# Patient Record
Sex: Male | Born: 1965 | Race: Black or African American | Hispanic: No | Marital: Married | Smoking: Never smoker
Health system: Southern US, Community
[De-identification: ages and names within clinical notes are randomized; demographics above are authoritative.]

## PROBLEM LIST (undated history)

## (undated) DIAGNOSIS — R42 Dizziness and giddiness: Secondary | ICD-10-CM

## (undated) DIAGNOSIS — G4730 Sleep apnea, unspecified: Secondary | ICD-10-CM

## (undated) DIAGNOSIS — I10 Essential (primary) hypertension: Secondary | ICD-10-CM

## (undated) HISTORY — DX: Sleep apnea, unspecified: G47.30

## (undated) HISTORY — PX: SHOULDER SURGERY: SHX246

## (undated) HISTORY — PX: KNEE SURGERY: SHX244

## (undated) HISTORY — PX: APPENDECTOMY: SHX54

## (undated) HISTORY — PX: BUNIONECTOMY: SHX129

---

## 2003-06-02 ENCOUNTER — Encounter: Admission: RE | Admit: 2003-06-02 | Discharge: 2003-06-02 | Payer: Self-pay | Admitting: Occupational Medicine

## 2003-12-25 ENCOUNTER — Encounter: Admission: RE | Admit: 2003-12-25 | Discharge: 2003-12-25 | Payer: Self-pay | Admitting: Emergency Medicine

## 2004-07-07 ENCOUNTER — Emergency Department (HOSPITAL_COMMUNITY): Admission: EM | Admit: 2004-07-07 | Discharge: 2004-07-07 | Payer: Self-pay | Admitting: Family Medicine

## 2005-02-09 ENCOUNTER — Emergency Department (HOSPITAL_COMMUNITY): Admission: EM | Admit: 2005-02-09 | Discharge: 2005-02-09 | Payer: Self-pay | Admitting: Emergency Medicine

## 2005-08-22 ENCOUNTER — Emergency Department (HOSPITAL_COMMUNITY): Admission: EM | Admit: 2005-08-22 | Discharge: 2005-08-22 | Payer: Self-pay | Admitting: Emergency Medicine

## 2007-11-07 ENCOUNTER — Encounter: Admission: RE | Admit: 2007-11-07 | Discharge: 2007-11-07 | Payer: Self-pay | Admitting: Internal Medicine

## 2010-08-07 ENCOUNTER — Encounter: Payer: Self-pay | Admitting: Emergency Medicine

## 2010-11-17 ENCOUNTER — Other Ambulatory Visit: Payer: Self-pay | Admitting: Neurosurgery

## 2010-11-17 DIAGNOSIS — M5020 Other cervical disc displacement, unspecified cervical region: Secondary | ICD-10-CM

## 2010-11-21 ENCOUNTER — Ambulatory Visit
Admission: RE | Admit: 2010-11-21 | Discharge: 2010-11-21 | Disposition: A | Discharge status: Home / Self Care | Payer: Medicare HMO | Source: Ambulatory Visit | Attending: Neurosurgery | Admitting: Neurosurgery

## 2010-11-21 DIAGNOSIS — M5020 Other cervical disc displacement, unspecified cervical region: Secondary | ICD-10-CM

## 2010-12-16 ENCOUNTER — Other Ambulatory Visit: Payer: Self-pay | Admitting: Neurosurgery

## 2010-12-16 DIAGNOSIS — M5020 Other cervical disc displacement, unspecified cervical region: Secondary | ICD-10-CM

## 2010-12-19 ENCOUNTER — Ambulatory Visit
Admission: RE | Admit: 2010-12-19 | Discharge: 2010-12-19 | Disposition: A | Discharge status: Home / Self Care | Payer: Medicare HMO | Source: Ambulatory Visit | Attending: Neurosurgery | Admitting: Neurosurgery

## 2010-12-19 DIAGNOSIS — M5020 Other cervical disc displacement, unspecified cervical region: Secondary | ICD-10-CM

## 2012-01-11 ENCOUNTER — Emergency Department (INDEPENDENT_AMBULATORY_CARE_PROVIDER_SITE_OTHER)
Admission: EM | Admit: 2012-01-11 | Discharge: 2012-01-11 | Disposition: A | Discharge status: Home / Self Care | Payer: Managed Care, Other (non HMO) | Source: Home / Self Care | Attending: Emergency Medicine | Admitting: Emergency Medicine

## 2012-01-11 ENCOUNTER — Encounter (HOSPITAL_COMMUNITY): Payer: Self-pay | Admitting: Emergency Medicine

## 2012-01-11 DIAGNOSIS — M26629 Arthralgia of temporomandibular joint, unspecified side: Secondary | ICD-10-CM

## 2012-01-11 DIAGNOSIS — I10 Essential (primary) hypertension: Secondary | ICD-10-CM

## 2012-01-11 DIAGNOSIS — R42 Dizziness and giddiness: Secondary | ICD-10-CM

## 2012-01-11 DIAGNOSIS — Z760 Encounter for issue of repeat prescription: Secondary | ICD-10-CM

## 2012-01-11 HISTORY — DX: Essential (primary) hypertension: I10

## 2012-01-11 HISTORY — DX: Dizziness and giddiness: R42

## 2012-01-11 MED ORDER — AMLODIPINE BESYLATE 10 MG PO TABS: 10.0000 mg | ORAL_TABLET | Freq: Every day | ORAL | Status: DC

## 2012-01-11 MED ORDER — LISINOPRIL 40 MG PO TABS: 40.0000 mg | ORAL_TABLET | Freq: Every day | ORAL | Status: AC

## 2012-01-11 MED ORDER — HYDROCHLOROTHIAZIDE 25 MG PO TABS: 25.0000 mg | ORAL_TABLET | Freq: Every day | ORAL | Status: AC

## 2012-01-11 MED ORDER — MECLIZINE HCL 25 MG PO TABS: 25.0000 mg | ORAL_TABLET | Freq: Four times a day (QID) | ORAL | Status: AC

## 2012-01-11 NOTE — Discharge Instructions (Signed)
Decrease your salt intake. diet and exercise will lower your blood pressure significantly. It is important to keep your blood pressure under good control, as having a elevated for prolonged periods of time significantly increases your risk of stroke, heart attacks, kidney damage, eye damage, and other problems. Return here in a week for blood pressure recheck if you're able to find a primary care physician by then. Return immediately to the ER if you start having chest pain, headache, problems seeing, problems talking, problems walking, if you feel like you're about to pass out, if you do pass out, if you have a seizure, or for any other concerns.[facility]: [address] [place] [address] [phone]  [facility]: [address] [place] [address]   [phone]  [facility]: [address] [place] [address] [phone]  [facility] : [address] [place] [address] [phone]  [facility]: [address] [place] [address] [phone]  [facility]: [address] [place] [address] [phone]  Dr. Oneal Grout [address] [facility] [place] [address] [phone]

## 2012-01-11 NOTE — ED Notes (Addendum)
Patient has been out of medicine for 4 days.  Patient normally gets refills from an urgent care in [place] where he lives.  Denies chest pain

## 2012-01-11 NOTE — ED Provider Notes (Signed)
History     CSN: [id number]  Arrival date & time 01/11/12  1132   First MD Initiated Contact with Patient 01/11/12 1227      Chief Complaint  Patient presents with  . Dizziness    (Consider location/radiation/quality/duration/timing/severity/associated sxs/prior treatment) HPI Comments: Patient presents with multiple complaints: First, patient reports right ear tinnitus, right ear pain, and a sensation that  "things seem to be moving to the right". reports sx worse when when turning his head. States sensation of "things moving" it started several hours prior to arrival. The vertigo lasted about 5 minutes, and then resolved. Symptoms are better when he closes his eyes, and lies still. Some nausea, no vomiting. No headaches, blurred vision. No ear fullness. No recent viral illness, antibiotic use, nasal congestion, sinus pain/pressure, purulent nasal drainage. No dental pain. No recent or remote history of trauma to his head. He has a history of vertigo, and states this feels identical to that. Said he had tinnitus and ear pain with his previous episode of vertigo. He also states that his jaw clicks when he opens and closes it, and that his ear pain is worse with eating, jaw movement. Patient also states that he ran out of all 3 of his blood pressure medications about 5 days ago. He denies headaches, seizures, dysarthria, focal weakness. No chest pain, shortness of breath, pain radiating through to the back, abdominal pain, and urea, hematuria, lower extremity swelling. Denies over-the-counter medication use, illicit drug use. No h/o seizure, stroke, diabetes, coronary disease.  ROS as noted in HPI. All other ROS negative.    Patient is a 46 y.o. male presenting with neurologic complaint and hypertension. The history is provided by the patient. No language interpreter was used.  Neurologic Problem The primary symptoms include dizziness. Primary symptoms do not include headaches, nausea or  vomiting. The symptoms began 6 to 12 hours ago. The symptoms are resolved.  He describes the dizziness as a sensation of spinning. The dizziness has been resolved since its onset. It is a recurrent problem. The dizziness is associated with rotation. Dizziness also occurs with tinnitus. Dizziness does not occur with blurred vision, hearing loss, nausea, vomiting, weakness or diaphoresis.  Additional symptoms include tinnitus and vertigo. Additional symptoms do not include weakness, loss of balance, photophobia or hearing loss. Medical issues also include diabetes and hypertension. Medical issues do not include seizures or cerebral vascular accident.  Hypertension This is a chronic problem. The current episode started more than 1 week ago. The problem occurs constantly. The problem has not changed since onset.Pertinent negatives include no chest pain, no abdominal pain, no headaches and no shortness of breath. Nothing aggravates the symptoms. Nothing relieves the symptoms. He has tried nothing for the symptoms. The treatment provided no relief.    Past Medical History  Diagnosis Date  . Hypertension   . Vertigo     Past Surgical History  Procedure Date  . Appendectomy   . Bunionectomy   . Shoulder surgery     left    History reviewed. No pertinent family history.  History  Substance Use Topics  . Smoking status: Never Smoker   . Smokeless tobacco: Not on file  . Alcohol Use: No      Review of Systems  Constitutional: Negative for diaphoresis.  HENT: Positive for tinnitus. Negative for hearing loss.   Eyes: Negative for blurred vision and photophobia.  Respiratory: Negative for shortness of breath.   Cardiovascular: Negative for chest pain.  Gastrointestinal:  Negative for nausea, vomiting and abdominal pain.  Neurological: Positive for dizziness and vertigo. Negative for weakness, headaches and loss of balance.    Allergies  Penicillins and Salicylates  Home Medications    Current Outpatient Rx  Name Route Sig Dispense Refill  . AMLODIPINE BESYLATE 10 MG PO TABS Oral Take 1 tablet (10 mg total) by mouth daily. 30 tablet 0  . HYDROCHLOROTHIAZIDE 25 MG PO TABS Oral Take 1 tablet (25 mg total) by mouth daily. 30 tablet 0  . LISINOPRIL 40 MG PO TABS Oral Take 1 tablet (40 mg total) by mouth daily. 30 tablet 0  . MECLIZINE HCL 25 MG PO TABS Oral Take 1 tablet (25 mg total) by mouth 4 (four) times daily. 28 tablet 0    BP 170/116  Pulse 80  Temp 98.3 F (36.8 C) (Oral)  Resp 16  SpO2 100% Filed Vitals:   01/11/12 1152 01/11/12 1207  BP: 159/106 170/116  Pulse: 78 80  Temp: 98.3 F (36.8 C)   TempSrc: Oral   Resp: 20 16  SpO2: 97% 100%     Physical Exam  Vitals reviewed. Constitutional: He appears well-developed and well-nourished. No distress.  HENT:  Right Ear: Tympanic membrane normal.  Left Ear: Tympanic membrane normal.  Nose: Nose normal. Right sinus exhibits no maxillary sinus tenderness and no frontal sinus tenderness. Left sinus exhibits no maxillary sinus tenderness and no frontal sinus tenderness.  Mouth/Throat: Uvula is midline, oropharynx is clear and moist and mucous membranes are normal.       Tenderness at right TMJ. No tenderness L side. Palpable click, pain aggravated with opening and closing jaw.  Eyes: Conjunctivae and EOM are normal. Pupils are equal, round, and reactive to light.  Fundoscopic exam:      The right eye shows no hemorrhage and no papilledema.       The left eye shows no hemorrhage and no papilledema.  Neck: Normal range of motion. Neck supple. No spinous process tenderness and no muscular tenderness present. Normal range of motion present. No Brudzinski's sign and no Kernig's sign noted.  Cardiovascular: Normal rate, regular rhythm, normal heart sounds and intact distal pulses.   Pulmonary/Chest: Effort normal and breath sounds normal.  Abdominal: He exhibits no distension.  Musculoskeletal: Normal range of  motion. He exhibits no edema.  Lymphadenopathy:    He has no cervical adenopathy.  Neurological: He is alert. He has normal strength and normal reflexes. He displays normal reflexes. No cranial nerve deficit or sensory deficit. He exhibits normal muscle tone. He displays a negative Romberg sign. Coordination and gait normal.       Positive Dix-Hallpike right side. Rapid hand movement, Finger-> nose, heel-> shin, tandem gait steady  Skin: Skin is warm. No rash noted.  Psychiatric: He has a normal mood and affect. His speech is normal and behavior is normal. Judgment and thought content normal.    ED Course  Procedures (including critical care time)  Labs Reviewed - No data to display No results found.   1. Vertigo   2. TMJ arthralgia   3. Hypertension   4. Medication refill     MDM  Previous records reviewed. As a history of herniated cervical disc. Otherwise unable to review old records.  H&P most consistent with peripheral vertigo. No evidence of otitis, sinusitis, or central causes of his symptoms at this time. His neurologic exam is completely normal except for a positive Dix-Hallpike. Will send him home on some meclizine for this.  He also is tenderness in his right TMJ, which would explain his right-sided otalgia. 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  Blood pressure is elevated, but he has not taken his medication over 5 days. Will restart his medications, and have him followup with her primary care physician of his choice. He can come back here for blood pressure recheck in a week if he is unable to find a primary care physician by then. He has no red flags concerning for hypertensive emergency currently. Discussed signs and symptoms that should prompt his return to the emergency department. Discussed MDM and plan with patient and his spouse. They agree with plan.  Luiz Blare, MD 01/11/12 1723

## 2012-01-11 NOTE — ED Notes (Signed)
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  Also reports right ear hurting for 2 days and worsened yesterday.  Patient reports these are symptoms experienced once before when diagnosed with vertigo and involved this same ear.

## 2013-05-30 ENCOUNTER — Other Ambulatory Visit: Payer: Self-pay | Admitting: Occupational Medicine

## 2013-05-30 ENCOUNTER — Ambulatory Visit: Payer: Self-pay

## 2013-05-30 DIAGNOSIS — Z0000 Encounter for general adult medical examination without abnormal findings: Secondary | ICD-10-CM

## 2016-01-14 ENCOUNTER — Ambulatory Visit: Payer: Self-pay

## 2016-01-14 ENCOUNTER — Other Ambulatory Visit: Payer: Self-pay | Admitting: Occupational Medicine

## 2016-01-14 DIAGNOSIS — Z0000 Encounter for general adult medical examination without abnormal findings: Secondary | ICD-10-CM

## 2017-09-14 ENCOUNTER — Encounter: Payer: Self-pay | Admitting: Internal Medicine

## 2017-10-01 ENCOUNTER — Encounter (HOSPITAL_COMMUNITY): Payer: Self-pay | Admitting: Emergency Medicine

## 2017-10-01 ENCOUNTER — Other Ambulatory Visit: Payer: Self-pay

## 2017-10-01 ENCOUNTER — Emergency Department (HOSPITAL_COMMUNITY)
Admission: EM | Admit: 2017-10-01 | Discharge: 2017-10-02 | Disposition: A | Discharge status: Home / Self Care | Payer: 59 | Attending: Emergency Medicine | Admitting: Emergency Medicine

## 2017-10-01 ENCOUNTER — Emergency Department (HOSPITAL_COMMUNITY): Payer: 59

## 2017-10-01 DIAGNOSIS — I10 Essential (primary) hypertension: Secondary | ICD-10-CM | POA: Insufficient documentation

## 2017-10-01 DIAGNOSIS — R110 Nausea: Secondary | ICD-10-CM | POA: Diagnosis not present

## 2017-10-01 DIAGNOSIS — R1013 Epigastric pain: Secondary | ICD-10-CM | POA: Insufficient documentation

## 2017-10-01 DIAGNOSIS — Z79899 Other long term (current) drug therapy: Secondary | ICD-10-CM | POA: Diagnosis not present

## 2017-10-01 DIAGNOSIS — R109 Unspecified abdominal pain: Secondary | ICD-10-CM

## 2017-10-01 DIAGNOSIS — R55 Syncope and collapse: Secondary | ICD-10-CM | POA: Diagnosis not present

## 2017-10-01 LAB — LIPASE, BLOOD: Lipase: 28 U/L (ref 11–51)

## 2017-10-01 LAB — CBC
HCT: 47 % (ref 39.0–52.0)
Hemoglobin: 15.9 g/dL (ref 13.0–17.0)
MCH: 28.6 pg (ref 26.0–34.0)
MCHC: 33.8 g/dL (ref 30.0–36.0)
MCV: 84.5 fL (ref 78.0–100.0)
Platelets: 125 10*3/uL — ABNORMAL LOW (ref 150–400)
RBC: 5.56 MIL/uL (ref 4.22–5.81)
RDW: 13.8 % (ref 11.5–15.5)
WBC: 5.4 10*3/uL (ref 4.0–10.5)

## 2017-10-01 LAB — COMPREHENSIVE METABOLIC PANEL
ALT: 17 U/L (ref 17–63)
AST: 21 U/L (ref 15–41)
Albumin: 4.2 g/dL (ref 3.5–5.0)
Alkaline Phosphatase: 77 U/L (ref 38–126)
Anion gap: 12 (ref 5–15)
BUN: 16 mg/dL (ref 6–20)
CO2: 28 mmol/L (ref 22–32)
Calcium: 8.9 mg/dL (ref 8.9–10.3)
Chloride: 97 mmol/L — ABNORMAL LOW (ref 101–111)
Creatinine, Ser: 1.41 mg/dL — ABNORMAL HIGH (ref 0.61–1.24)
GFR calc Af Amer: >60 mL/min (ref >60)
GFR calc non Af Amer: 57 mL/min — ABNORMAL LOW (ref >60)
Glucose, Bld: 94 mg/dL (ref 65–99)
Potassium: 3.2 mmol/L — ABNORMAL LOW (ref 3.5–5.1)
Sodium: 137 mmol/L (ref 135–145)
Total Bilirubin: 1.1 mg/dL (ref 0.3–1.2)
Total Protein: 7.7 g/dL (ref 6.5–8.1)

## 2017-10-01 LAB — URINALYSIS, ROUTINE W REFLEX MICROSCOPIC
Bacteria, UA: NONE SEEN
Bilirubin Urine: NEGATIVE
Glucose, UA: NEGATIVE mg/dL
Hgb urine dipstick: NEGATIVE
Ketones, ur: NEGATIVE mg/dL
Leukocytes, UA: NEGATIVE
Nitrite: NEGATIVE
Protein, ur: 100 mg/dL — AB
Specific Gravity, Urine: 1.03 (ref 1.005–1.030)
pH: 5 (ref 5.0–8.0)

## 2017-10-01 LAB — TROPONIN I: Troponin I: <0.03 ng/mL (ref <0.03)

## 2017-10-01 MED ORDER — IOPAMIDOL (ISOVUE-300) INJECTION 61%
100.0000 mL | Freq: Once | INTRAVENOUS | Status: AC | PRN
  Administered 2017-10-01: 100 mL via INTRAVENOUS

## 2017-10-01 NOTE — ED Provider Notes (Signed)
[facility] EMERGENCY DEPARTMENT Provider Note   CSN: [id number] Arrival date & time: 10/01/17  1922     History   Chief Complaint Chief Complaint  Patient presents with  . Abdominal Pain    HPI Don Booth is a 52 y.o. male.   Abdominal Pain   This is a new problem. The current episode started 2 days ago. The problem occurs constantly. The problem has not changed since onset.The pain is associated with eating. The pain is located in the epigastric region. The quality of the pain is aching. The pain is moderate. Associated symptoms include nausea.  Loss of Consciousness   This is a new problem. The current episode started 6 to 12 hours ago. The problem occurs constantly. The problem has been resolved. Length of episode of loss of consciousness: unsure. Associated symptoms include abdominal pain and nausea. He has tried nothing for the symptoms.    Past Medical History:  Diagnosis Date  . Hypertension   . Vertigo     There are no active problems to display for this patient.   Past Surgical History:  Procedure Laterality Date  . APPENDECTOMY    . BUNIONECTOMY    . SHOULDER SURGERY     left       Home Medications    Prior to Admission medications   Medication Sig Start Date End Date Taking? Authorizing Provider  amLODipine (NORVASC) 10 MG tablet Take 1 tablet (10 mg total) by mouth daily. 01/11/12   Domenick Gong, MD  hydrochlorothiazide (HYDRODIURIL) 25 MG tablet Take 1 tablet (25 mg total) by mouth daily. 01/11/12   Domenick Gong, MD  lisinopril (PRINIVIL,ZESTRIL) 40 MG tablet Take 1 tablet (40 mg total) by mouth daily. 01/11/12   Domenick Gong, MD    Family History History reviewed. No pertinent family history.  Social History Social History   Tobacco Use  . Smoking status: Never Smoker  . Smokeless tobacco: Never Used  Substance Use Topics  . Alcohol use: No  . Drug use: No     Allergies   Penicillins and Salicylates   Review of  Systems Review of Systems  Cardiovascular: Positive for syncope.  Gastrointestinal: Positive for abdominal pain and nausea.  All other systems reviewed and are negative.    Physical Exam Updated Vital Signs BP 139/90   Pulse 72   Temp 98.6 F (37 C) (Oral)   Resp 14   Ht 5\' 11"  (1.803 m)   Wt 131.5 kg (290 lb)   SpO2 99%   BMI 40.45 kg/m   Physical Exam  Constitutional: He is oriented to person, place, and time. He appears well-developed and well-nourished.  HENT:  Head: Normocephalic and atraumatic.  Eyes: Conjunctivae and EOM are normal.  Neck: Normal range of motion.  Cardiovascular: Normal rate.  Pulmonary/Chest: Effort normal. No respiratory distress.  Abdominal: Soft. He exhibits no distension.  Musculoskeletal: Normal range of motion. He exhibits no edema or deformity.  Neurological: He is alert and oriented to person, place, and time. No cranial nerve deficit. Coordination normal.  Skin: Skin is warm and dry.  Nursing note and vitals reviewed.    ED Treatments / Results  Labs (all labs ordered are listed, but only abnormal results are displayed) Labs Reviewed  COMPREHENSIVE METABOLIC PANEL - Abnormal; Notable for the following components:      Result Value   Potassium 3.2 (*)    Chloride 97 (*)    Creatinine, Ser 1.41 (*)    GFR  calc non Af Amer 57 (*)    All other components within normal limits  CBC - Abnormal; Notable for the following components:   Platelets 125 (*)    All other components within normal limits  URINALYSIS, ROUTINE W REFLEX MICROSCOPIC - Abnormal; Notable for the following components:   Protein, ur 100 (*)    Squamous Epithelial / LPF 0-5 (*)    All other components within normal limits  LIPASE, BLOOD  TROPONIN I    EKG  EKG Interpretation  Date/Time:  Monday October 01 2017 19:47:37 EDT Ventricular Rate:  75 PR Interval:  150 QRS Duration: 92 QT Interval:  380 QTC Calculation: 424 R Axis:   -36 Text Interpretation:   Normal sinus rhythm Left axis deviation Abnormal ECG No significant change since last tracing Confirmed by Marily MemosMesner, Jahnasia Tatum [phone]([id number]) on 10/01/2017 10:43:28 PM       Radiology Ct Abdomen Pelvis W Contrast  Result Date: 10/01/2017 CLINICAL DATA:  Abdominal pain with vomiting EXAM: CT ABDOMEN AND PELVIS WITH CONTRAST TECHNIQUE: Multidetector CT imaging of the abdomen and pelvis was performed using the standard protocol following bolus administration of intravenous contrast. CONTRAST:  100mL ISOVUE-300 IOPAMIDOL (ISOVUE-300) INJECTION 61% COMPARISON:  09/01/2011 FINDINGS: Lower chest: Lung bases demonstrate no acute consolidation or pleural effusion. Normal heart size. Hepatobiliary: Multiple subcentimeter hypodense foci in the liver, no significant change, likely cysts. Larger lesions are consistent with cysts. No calcified stones in the gallbladder. Negative for biliary dilatation. Pancreas: Unremarkable. No pancreatic ductal dilatation or surrounding inflammatory changes. Spleen: Normal in size without focal abnormality. Adrenals/Urinary Tract: Adrenal glands are unremarkable. Kidneys are normal, without renal calculi, focal lesion, or hydronephrosis. Bladder is slightly thick walled but is nearly empty. Stomach/Bowel: Stomach is within normal limits. Status post appendectomy. No evidence of bowel wall thickening, distention, or inflammatory changes. Vascular/Lymphatic: Nonaneurysmal aorta. No significantly enlarged lymph nodes. Reproductive: Prostate is unremarkable. Other: Negative for free air or free fluid. Interval umbilical hernia repair. Musculoskeletal: Degenerative changes at L5-S1. Trace retrolisthesis of L5 on S1. Slight increased size of a sclerotic focus in the right anterior acetabulum, possibly bone island. IMPRESSION: 1. No CT evidence for acute intra-abdominal or pelvic abnormality. 2. Multiple cysts in the liver 3. Slightly thick-walled appearance of the bladder likely due to under distension  Electronically Signed   By: Jasmine PangKim  Fujinaga M.D.   On: 10/01/2017 22:47    Procedures Procedures (including critical care time)  Medications Ordered in ED Medications  iopamidol (ISOVUE-300) 61 % injection 100 mL (100 mLs Intravenous Contrast Given 10/01/17 2228)     Initial Impression / Assessment and Plan / ED Course  I have reviewed the triage vital signs and the nursing notes.  Pertinent labs & imaging results that were available during my care of the patient were reviewed by me and considered in my medical decision making (see chart for details).     Syncope vs seizure of unclear etiology. Gi illness of unclear etiolgoy as well. Appears well. Workup negative.  Monitor for multiple hours here without any clear arrhythmias.  Patient feels close to baseline.  Unsure if he has some type of vagal reaction because of vomiting.  Could also have been a seizure as he did have some stool on himself when he woke up.  Will refer to PCP for further workup and management.  Final Clinical Impressions(s) / ED Diagnoses   Final diagnoses:  Abdominal pain, unspecified abdominal location  Syncope, unspecified syncope type    ED Discharge Orders  None       Emogene Muratalla, Barbara Cower, MD 10/01/17 2356

## 2017-10-01 NOTE — ED Triage Notes (Signed)
Patient has had abdominal pain since Saturday, today had two episodes of vomiting, one episode of diarrhea, and on the way to work, patient had syncopal episode in car, not sure how long it lasted.

## 2017-10-12 ENCOUNTER — Encounter: Payer: Self-pay | Admitting: Internal Medicine

## 2017-11-09 ENCOUNTER — Other Ambulatory Visit: Payer: Self-pay

## 2017-11-09 ENCOUNTER — Ambulatory Visit (AMBULATORY_SURGERY_CENTER): Payer: Self-pay | Admitting: *Deleted

## 2017-11-09 ENCOUNTER — Encounter: Payer: Self-pay | Admitting: Internal Medicine

## 2017-11-09 VITALS — Ht 71.0 in | Wt 292.0 lb

## 2017-11-09 DIAGNOSIS — Z1211 Encounter for screening for malignant neoplasm of colon: Secondary | ICD-10-CM

## 2017-11-09 MED ORDER — NA SULFATE-K SULFATE-MG SULF 17.5-3.13-1.6 GM/177ML PO SOLN: ORAL | 0 refills | Status: DC

## 2017-11-09 NOTE — Progress Notes (Signed)
No egg or soy allergy  No diet medications taken  No home oxygen used  No anesthesia or intubation problems per pt  Registered in emmi  $15 off Suprep coupon given

## 2017-11-23 ENCOUNTER — Encounter: Payer: Self-pay | Admitting: Internal Medicine

## 2017-11-23 ENCOUNTER — Ambulatory Visit (AMBULATORY_SURGERY_CENTER): Payer: 59 | Admitting: Internal Medicine

## 2017-11-23 ENCOUNTER — Other Ambulatory Visit: Payer: Self-pay

## 2017-11-23 VITALS — BP 129/90 | HR 68 | Temp 98.9°F | Resp 14 | Ht 71.0 in | Wt 292.0 lb

## 2017-11-23 DIAGNOSIS — Z1211 Encounter for screening for malignant neoplasm of colon: Secondary | ICD-10-CM | POA: Diagnosis present

## 2017-11-23 MED ORDER — SODIUM CHLORIDE 0.9 % IV SOLN: 500.0000 mL | Freq: Once | INTRAVENOUS | Status: AC

## 2017-11-23 NOTE — Op Note (Signed)
[facility] Patient Name: Don Booth Procedure Date: 11/23/2017 2:38 PM MRN: [id number] Endoscopist: Beverley Fiedler , MD Age: 52 Referring MD:  Date of Birth: [birth year] Gender: Male Account #: [id number] Procedure:                Colonoscopy Indications:              Screening for colorectal malignant neoplasm, This                            is the patient's first colonoscopy Medicines:                Monitored Anesthesia Care Procedure:                Pre-Anesthesia Assessment:                           - Prior to the procedure, a History and Physical                            was performed, and patient medications and                            allergies were reviewed. The patient's tolerance of                            previous anesthesia was also reviewed. The risks                            and benefits of the procedure and the sedation                            options and risks were discussed with the patient.                            All questions were answered, and informed consent                            was obtained. Prior Anticoagulants: The patient has                            taken no previous anticoagulant or antiplatelet                            agents. ASA Grade Assessment: II - A patient with                            mild systemic disease. After reviewing the risks                            and benefits, the patient was deemed in                            satisfactory condition to undergo the procedure.  After obtaining informed consent, the colonoscope                            was passed under direct vision. Throughout the                            procedure, the patient's blood pressure, pulse, and                            oxygen saturations were monitored continuously. The                            Colonoscope was introduced through the anus and                            advanced to the cecum,  identified by appendiceal                            orifice and ileocecal valve. The colonoscopy was                            performed without difficulty. The patient tolerated                            the procedure well. The quality of the bowel                            preparation was good. The ileocecal valve,                            appendiceal orifice, and rectum were photographed. Scope In: 2:43:27 PM Scope Out: 2:57:42 PM Scope Withdrawal Time: 0 hours 11 minutes 16 seconds  Total Procedure Duration: 0 hours 14 minutes 15 seconds  Findings:                 The digital rectal exam was normal.                           The entire examined colon appeared normal.                           Internal hemorrhoids were found during                            retroflexion. The hemorrhoids were small. Complications:            No immediate complications. Estimated Blood Loss:     Estimated blood loss: none. Impression:               - The entire examined colon is normal.                           - Internal hemorrhoids.                           - No specimens collected. Recommendation:           -  Patient has a contact number available for                            emergencies. The signs and symptoms of potential                            delayed complications were discussed with the                            patient. Return to normal activities tomorrow.                            Written discharge instructions were provided to the                            patient.                           - Continue present medications.                           - Resume previous diet.                           - Repeat colonoscopy in 10 years for screening                            purposes. Beverley Fiedler, MD 11/23/2017 3:01:37 PM This report has been signed electronically.

## 2017-11-23 NOTE — Progress Notes (Signed)
Report to PACU, RN, vss, BBS= Clear.  

## 2017-11-23 NOTE — Patient Instructions (Signed)
  Thank you for allowing [place] to care for you today!  Resume previous diet and medications.  Repeat colonoscopy in 10 years.    YOU HAD AN ENDOSCOPIC PROCEDURE TODAY AT THE [facility]:   Refer to the procedure report that was given to you for any specific questions about what was found during the examination.  If the procedure report does not answer your questions, please call your gastroenterologist to clarify.  If you requested that your care partner not be given the details of your procedure findings, then the procedure report has been included in a sealed envelope for you to review at your convenience later.  YOU SHOULD EXPECT: Some feelings of bloating in the abdomen. Passage of more gas than usual.  Walking can help get rid of the air that was put into your GI tract during the procedure and reduce the bloating. If you had a lower endoscopy (such as a colonoscopy or flexible sigmoidoscopy) you may notice spotting of blood in your stool or on the toilet paper. If you underwent a bowel prep for your procedure, you may not have a normal bowel movement for a few days.  Please Note:  You might notice some irritation and congestion in your nose or some drainage.  This is from the oxygen used during your procedure.  There is no need for concern and it should clear up in a day or so.  SYMPTOMS TO REPORT IMMEDIATELY:   Following lower endoscopy (colonoscopy or flexible sigmoidoscopy):  Excessive amounts of blood in the stool  Significant tenderness or worsening of abdominal pains  Swelling of the abdomen that is new, acute  Fever of 100F or higher  For urgent or emergent issues, a gastroenterologist can be reached at any hour by calling [phone].   DIET:  We do recommend a small meal at first, but then you may proceed to your regular diet.  Drink plenty of fluids but you should avoid alcoholic beverages for 24 hours.  ACTIVITY:  You should plan to take it easy for the rest  of today and you should NOT DRIVE or use heavy machinery until tomorrow (because of the sedation medicines used during the test).    FOLLOW UP: Our staff will call the number listed on your records the next business day following your procedure to check on you and address any questions or concerns that you may have regarding the information given to you following your procedure. If we do not reach you, we will leave a message.  However, if you are feeling well and you are not experiencing any problems, there is no need to return our call.  We will assume that you have returned to your regular daily activities without incident.  If any biopsies were taken you will be contacted by phone or by letter within the next 1-3 weeks.  Please call us at [phone] if you have not heard about the biopsies in 3 weeks.    SIGNATURES/CONFIDENTIALITY: You and/or your care partner have signed paperwork which will be entered into your electronic medical record.  These signatures attest to the fact that that the information above on your After Visit Summary has been reviewed and is understood.  Full responsibility of the confidentiality of this discharge information lies with you and/or your care-partner.

## 2017-11-26 ENCOUNTER — Telehealth: Payer: Self-pay | Admitting: *Deleted

## 2017-11-26 NOTE — Telephone Encounter (Signed)
  Follow up Call-  Call back number 11/23/2017  Post procedure Call Back phone  # [phone]  Permission to leave phone message Yes  Some recent data might be hidden     Patient questions:  Do you have a fever, pain , or abdominal swelling? No. Pain Score  0 *  Have you tolerated food without any problems? Yes.    Have you been able to return to your normal activities? Yes.    Do you have any questions about your discharge instructions: Diet   No. Medications  No. Follow up visit  No.  Do you have questions or concerns about your Care? No.  Actions: * If pain score is 4 or above: No action needed, pain <4.

## 2018-02-15 ENCOUNTER — Encounter (HOSPITAL_COMMUNITY): Payer: Self-pay | Admitting: *Deleted

## 2018-02-15 ENCOUNTER — Emergency Department (HOSPITAL_COMMUNITY)
Admission: EM | Admit: 2018-02-15 | Discharge: 2018-02-15 | Disposition: A | Discharge status: Home / Self Care | Payer: 59 | Attending: Emergency Medicine | Admitting: Emergency Medicine

## 2018-02-15 ENCOUNTER — Other Ambulatory Visit: Payer: Self-pay

## 2018-02-15 ENCOUNTER — Emergency Department (HOSPITAL_COMMUNITY): Payer: 59

## 2018-02-15 DIAGNOSIS — Z79899 Other long term (current) drug therapy: Secondary | ICD-10-CM | POA: Diagnosis not present

## 2018-02-15 DIAGNOSIS — Y9289 Other specified places as the place of occurrence of the external cause: Secondary | ICD-10-CM | POA: Insufficient documentation

## 2018-02-15 DIAGNOSIS — S20469A Insect bite (nonvenomous) of unspecified back wall of thorax, initial encounter: Secondary | ICD-10-CM | POA: Diagnosis present

## 2018-02-15 DIAGNOSIS — Y999 Unspecified external cause status: Secondary | ICD-10-CM | POA: Diagnosis not present

## 2018-02-15 DIAGNOSIS — R0789 Other chest pain: Secondary | ICD-10-CM | POA: Insufficient documentation

## 2018-02-15 DIAGNOSIS — W57XXXA Bitten or stung by nonvenomous insect and other nonvenomous arthropods, initial encounter: Secondary | ICD-10-CM | POA: Diagnosis not present

## 2018-02-15 DIAGNOSIS — Y9389 Activity, other specified: Secondary | ICD-10-CM | POA: Diagnosis not present

## 2018-02-15 DIAGNOSIS — L03312 Cellulitis of back [any part except buttock]: Secondary | ICD-10-CM

## 2018-02-15 DIAGNOSIS — I10 Essential (primary) hypertension: Secondary | ICD-10-CM | POA: Diagnosis not present

## 2018-02-15 LAB — CBC
HCT: 48.8 % (ref 39.0–52.0)
Hemoglobin: 16.2 g/dL (ref 13.0–17.0)
MCH: 28.6 pg (ref 26.0–34.0)
MCHC: 33.2 g/dL (ref 30.0–36.0)
MCV: 86.1 fL (ref 78.0–100.0)
Platelets: 152 10*3/uL (ref 150–400)
RBC: 5.67 MIL/uL (ref 4.22–5.81)
RDW: 13.4 % (ref 11.5–15.5)
WBC: 9.1 10*3/uL (ref 4.0–10.5)

## 2018-02-15 LAB — BASIC METABOLIC PANEL
ANION GAP: 9 (ref 5–15)
BUN: 7 mg/dL (ref 6–20)
CALCIUM: 9.2 mg/dL (ref 8.9–10.3)
CO2: 30 mmol/L (ref 22–32)
Chloride: 101 mmol/L (ref 98–111)
Creatinine, Ser: 1.29 mg/dL — ABNORMAL HIGH (ref 0.61–1.24)
GLUCOSE: 103 mg/dL — AB (ref 70–99)
Potassium: 3.4 mmol/L — ABNORMAL LOW (ref 3.5–5.1)
Sodium: 140 mmol/L (ref 135–145)

## 2018-02-15 LAB — I-STAT TROPONIN, ED
TROPONIN I, POC: 0 ng/mL (ref 0.00–0.08)
Troponin i, poc: 0 ng/mL (ref 0.00–0.08)
Troponin i, poc: 0 ng/mL (ref 0.00–0.08)

## 2018-02-15 LAB — BRAIN NATRIURETIC PEPTIDE: B Natriuretic Peptide: 41.1 pg/mL (ref 0.0–100.0)

## 2018-02-15 MED ORDER — DIPHENHYDRAMINE HCL 25 MG PO CAPS
25.0000 mg | ORAL_CAPSULE | Freq: Once | ORAL | Status: AC
  Administered 2018-02-15: 25 mg via ORAL
  Filled 2018-02-15: qty 1

## 2018-02-15 MED ORDER — CLINDAMYCIN HCL 150 MG PO CAPS: 450.0000 mg | ORAL_CAPSULE | Freq: Three times a day (TID) | ORAL | 0 refills | Status: AC

## 2018-02-15 NOTE — ED Notes (Signed)
Pt states that 2 weeks ago he was sting by yellow jacket in left inner thigh. Pt states that he was stung in the neck a few days ago and today started having pain in his neck that radiated into his chest with some SOB. Pt reports that pain got worse while at work today.

## 2018-02-15 NOTE — ED Provider Notes (Signed)
MSE was initiated and I personally evaluated the patient and placed orders (if any) at  9:50 AM on February 15, 2018.  The patient appears stable so that the remainder of the MSE may be completed by another provider.  Patient brought in by EMS for chest pain, onset this morning while at work[facility]. Pain sharp in nature, gradual onset. Hx HTN, no hx of DM or cholesterol. No significnat family history. Patient was stung by a bee on his left inner thigh 2 weeks ago and right upper back a few days ago.   Jeannie FendMurphy, Keva Darty A, PA-C 02/15/18 [phone]    Margarita Grizzleay, Danielle, MD 02/15/18 [phone]

## 2018-02-15 NOTE — ED Triage Notes (Signed)
Pt in via EMS to triage, c/o multiple bee stings two days ago, c/o continued pain to the sites

## 2018-02-15 NOTE — ED Notes (Addendum)
Pt ambulated to car with steady gait

## 2018-02-15 NOTE — Discharge Instructions (Addendum)
I  have prescribed antibiotics to treat your skin infection.Please take antibiotic as directed.If your symptoms worsen please return to the ED.Follow up with PCP for re evaluation of your symptoms in 1 week.

## 2018-02-15 NOTE — ED Provider Notes (Signed)
[facility] EMERGENCY DEPARTMENT Provider Note   CSN: [id number] Arrival date & time: 02/15/18  0911     History   Chief Complaint Chief Complaint  Patient presents with  . Insect Bite  . Chest Pain    HPI Don Booth is a 52 y.o. male.  52 y/o male with a PMH of HTN presents to the ED with a chief complaint of chest which began yesterday at work.Patient states he was bitten by a yellow jacket yesterday when the chest pain began.He describes it as squeezing mainly in the middle of his chest with no radiation. Patient states his pain is better with rest but worst with exertion. He does not have a previous history of CAD or family history of CAD. Patient states he had a stress test done 2 months ago in which results were normal. Patient has applied benadryl cream to his wound but states the itching persists.He denies any shortness of breath, abdominal pain, or abdominal complaints.      Past Medical History:  Diagnosis Date  . Hypertension   . Sleep apnea    wears CPAP  . Vertigo     There are no active problems to display for this patient.   Past Surgical History:  Procedure Laterality Date  . APPENDECTOMY    . BUNIONECTOMY Bilateral   . KNEE SURGERY     left knee  . SHOULDER SURGERY     left        Home Medications    Prior to Admission medications   Medication Sig Start Date End Date Taking? Authorizing Provider  amLODipine (NORVASC) 10 MG tablet Take 1 tablet (10 mg total) by mouth daily. 01/11/12   Domenick Gong, MD  hydrochlorothiazide (HYDRODIURIL) 25 MG tablet Take 1 tablet (25 mg total) by mouth daily. 01/11/12   Domenick Gong, MD  lisinopril (PRINIVIL,ZESTRIL) 40 MG tablet Take 1 tablet (40 mg total) by mouth daily. 01/11/12   Domenick Gong, MD    Family History Family History  Problem Relation Age of Onset  . Prostate cancer Maternal Grandfather   . Colon cancer Neg Hx   . Esophageal cancer Neg Hx   . Stomach  cancer Neg Hx   . Rectal cancer Neg Hx     Social History Social History   Tobacco Use  . Smoking status: Never Smoker  . Smokeless tobacco: Never Used  Substance Use Topics  . Alcohol use: Yes    Comment: once monthly- wine  . Drug use: No     Allergies   Penicillins and Salicylates   Review of Systems Review of Systems  Constitutional: Negative for chills and fever.  HENT: Negative for ear pain and sore throat.   Eyes: Negative for pain and visual disturbance.  Respiratory: Negative for cough and shortness of breath.   Cardiovascular: Positive for chest pain. Negative for palpitations and leg swelling.  Gastrointestinal: Negative for abdominal pain and vomiting.  Genitourinary: Negative for dysuria, flank pain and hematuria.  Musculoskeletal: Negative for arthralgias and back pain.  Skin: Positive for wound. Negative for color change and rash.  Neurological: Negative for seizures and syncope.  All other systems reviewed and are negative.    Physical Exam Updated Vital Signs BP (!) 145/100   Pulse (!) 51   Temp 98 F (36.7 C) (Oral)   Resp 12   Ht 5\' 11"  (1.803 m)   Wt 131.5 kg (290 lb)   SpO2 100%   BMI 40.45 kg/m  Physical Exam  Constitutional: He is oriented to person, place, and time. He appears well-developed and well-nourished.  HENT:  Head: Normocephalic and atraumatic.  Mouth/Throat: Oropharynx is clear and moist.  Eyes: Pupils are equal, round, and reactive to light. No scleral icterus.  Neck: Normal range of motion.  Cardiovascular: Normal heart sounds. Bradycardia present.  Pulmonary/Chest: Effort normal and breath sounds normal. He has no wheezes. He exhibits no tenderness.  Abdominal: Soft. Bowel sounds are normal. He exhibits no distension. There is no tenderness.  Musculoskeletal: He exhibits no tenderness or deformity.       Right lower leg: He exhibits no tenderness and no edema.       Left lower leg: He exhibits no tenderness and no  edema.  Neurological: He is alert and oriented to person, place, and time.  Skin: Skin is warm and dry. Capillary refill takes less than 2 seconds. There is erythema.     Nursing note and vitals reviewed.    ED Treatments / Results  Labs (all labs ordered are listed, but only abnormal results are displayed) Labs Reviewed  BASIC METABOLIC PANEL - Abnormal; Notable for the following components:      Result Value   Potassium 3.4 (*)    Glucose, Bld 103 (*)    Creatinine, Ser 1.29 (*)    All other components within normal limits  CBC  BRAIN NATRIURETIC PEPTIDE  I-STAT TROPONIN, ED  I-STAT TROPONIN, ED  I-STAT TROPONIN, ED  I-STAT TROPONIN, ED    EKG EKG Interpretation  Date/Time:  Friday February 15 2018 09:56:38 EDT Ventricular Rate:  58 PR Interval:  174 QRS Duration: 94 QT Interval:  418 QTC Calculation: 410 R Axis:   -23 Text Interpretation:  Sinus bradycardia Otherwise normal ECG Confirmed by Loren Racer ([id number]) on 02/15/2018 1:37:38 PM   Radiology Dg Chest 2 View  Result Date: 02/15/2018 CLINICAL DATA:  Shortness of Breath following be stains EXAM: CHEST - 2 VIEW COMPARISON:  01/14/2016 FINDINGS: The heart size and mediastinal contours are within normal limits. Both lungs are clear. The visualized skeletal structures are unremarkable. IMPRESSION: No active cardiopulmonary disease. Electronically Signed   By: Alcide Clever M.D.   On: 02/15/2018 10:07    Procedures Procedures (including critical care time)  Medications Ordered in ED Medications  diphenhydrAMINE (BENADRYL) capsule 25 mg (25 mg Oral Given 02/15/18 1321)     Initial Impression / Assessment and Plan / ED Course  I have reviewed the triage vital signs and the nursing notes.  Pertinent labs & imaging results that were available during my care of the patient were reviewed by me and considered in my medical decision making (see chart for details).  Clinical Course as of Feb 15 1546  Fri Feb 15, 2018    1414 Creatinine(!): 1.29 [JS]  1530 I-stat troponin, ED [JS]  1530 I-stat troponin, ED [JS]    Clinical Course User Index [JS] Claude Manges, PA-C    Patient presents with chest pain that began 24 hours ago.EKG showed no STEMI or infarct changes, his first troponin upon arrival was negative.Will order delta troponin to r/o any cardiac abnormality.   BMP showed creatine at 1.29 which is improved since last 1.5 . CBC showed no leukocytosis. Chest xray showed no active cardiopulmonary disease. Patient denies his chest pain being positional , there are also no changes in his EKG, I believe pericarditis is less likely. He is hemodynamically stable, I believe cardiac tamponade is less likely. He does not  complain of swelling in his legs, or orthopnea, exam was unremarkable I believe heart failure is less likely  Heart score is 2, low risk.Patient had a negative stress test 2 months ago. Patient is bradycardic while in the ED but denies any dizziness, lightheadedness.   3:35 PM Second troponin was negative at this time Lavenia Atlasve had a shared decision making with patient stating his EKG was normal and troponin have been negative x2, we can discharge him and have him follow up outpatient. Patient agrees with plan and states he will follow up with PCP. Will prescribe clindamycin for cellulitis from his bee stings. Return precautions provided.   Final Clinical Impressions(s) / ED Diagnoses   Final diagnoses:  Atypical chest pain  Insect bite of back, unspecified laterality, initial encounter    ED Discharge Orders    None       Claude MangesSoto, Tyree Vandruff, PA-C 02/15/18 1559    Loren RacerYelverton, David, MD 02/16/18 [phone]

## 2018-02-15 NOTE — ED Notes (Signed)
Patient verbalizes understanding of discharge instructions. Opportunity for questioning and answers were provided. Armband removed by staff, pt discharged from ED.  

## 2018-12-25 ENCOUNTER — Emergency Department (HOSPITAL_COMMUNITY)
Admission: EM | Admit: 2018-12-25 | Discharge: 2018-12-26 | Disposition: A | Discharge status: Home / Self Care | Payer: 59 | Attending: Emergency Medicine | Admitting: Emergency Medicine

## 2018-12-25 ENCOUNTER — Encounter (HOSPITAL_COMMUNITY): Payer: Self-pay

## 2018-12-25 ENCOUNTER — Emergency Department (HOSPITAL_COMMUNITY): Payer: 59

## 2018-12-25 ENCOUNTER — Other Ambulatory Visit: Payer: Self-pay

## 2018-12-25 DIAGNOSIS — S0990XA Unspecified injury of head, initial encounter: Secondary | ICD-10-CM | POA: Diagnosis present

## 2018-12-25 DIAGNOSIS — Y92410 Unspecified street and highway as the place of occurrence of the external cause: Secondary | ICD-10-CM | POA: Diagnosis not present

## 2018-12-25 DIAGNOSIS — S3991XA Unspecified injury of abdomen, initial encounter: Secondary | ICD-10-CM | POA: Diagnosis not present

## 2018-12-25 DIAGNOSIS — R079 Chest pain, unspecified: Secondary | ICD-10-CM | POA: Diagnosis not present

## 2018-12-25 DIAGNOSIS — Y999 Unspecified external cause status: Secondary | ICD-10-CM | POA: Insufficient documentation

## 2018-12-25 DIAGNOSIS — Z79899 Other long term (current) drug therapy: Secondary | ICD-10-CM | POA: Diagnosis not present

## 2018-12-25 DIAGNOSIS — Y9389 Activity, other specified: Secondary | ICD-10-CM | POA: Diagnosis not present

## 2018-12-25 DIAGNOSIS — S299XXA Unspecified injury of thorax, initial encounter: Secondary | ICD-10-CM | POA: Diagnosis not present

## 2018-12-25 DIAGNOSIS — I10 Essential (primary) hypertension: Secondary | ICD-10-CM | POA: Insufficient documentation

## 2018-12-25 LAB — CBC WITH DIFFERENTIAL/PLATELET
Abs Immature Granulocytes: 0.02 10*3/uL (ref 0.00–0.07)
Basophils Absolute: 0 10*3/uL (ref 0.0–0.1)
Basophils Relative: 0 %
Eosinophils Absolute: 0.1 10*3/uL (ref 0.0–0.5)
Eosinophils Relative: 1 %
HCT: 48.6 % (ref 39.0–52.0)
Hemoglobin: 16.2 g/dL (ref 13.0–17.0)
Immature Granulocytes: 0 %
Lymphocytes Relative: 23 %
Lymphs Abs: 2.2 10*3/uL (ref 0.7–4.0)
MCH: 29 pg (ref 26.0–34.0)
MCHC: 33.3 g/dL (ref 30.0–36.0)
MCV: 86.9 fL (ref 80.0–100.0)
Monocytes Absolute: 0.8 10*3/uL (ref 0.1–1.0)
Monocytes Relative: 8 %
Neutro Abs: 6.6 10*3/uL (ref 1.7–7.7)
Neutrophils Relative %: 68 %
Platelets: 154 10*3/uL (ref 150–400)
RBC: 5.59 MIL/uL (ref 4.22–5.81)
RDW: 13.8 % (ref 11.5–15.5)
WBC: 9.8 10*3/uL (ref 4.0–10.5)
nRBC: 0 % (ref 0.0–0.2)

## 2018-12-25 LAB — COMPREHENSIVE METABOLIC PANEL
ALT: 23 U/L (ref 0–44)
AST: 21 U/L (ref 15–41)
Albumin: 4.8 g/dL (ref 3.5–5.0)
Alkaline Phosphatase: 75 U/L (ref 38–126)
Anion gap: 10 (ref 5–15)
BUN: 20 mg/dL (ref 6–20)
CO2: 27 mmol/L (ref 22–32)
Calcium: 9.3 mg/dL (ref 8.9–10.3)
Chloride: 105 mmol/L (ref 98–111)
Creatinine, Ser: 1.76 mg/dL — ABNORMAL HIGH (ref 0.61–1.24)
GFR calc Af Amer: 51 mL/min — ABNORMAL LOW (ref >60)
GFR calc non Af Amer: 44 mL/min — ABNORMAL LOW (ref >60)
Glucose, Bld: 94 mg/dL (ref 70–99)
Potassium: 3.6 mmol/L (ref 3.5–5.1)
Sodium: 142 mmol/L (ref 135–145)
Total Bilirubin: 0.7 mg/dL (ref 0.3–1.2)
Total Protein: 7.9 g/dL (ref 6.5–8.1)

## 2018-12-25 MED ORDER — IOHEXOL 300 MG/ML  SOLN
80.0000 mL | Freq: Once | INTRAMUSCULAR | Status: AC | PRN
  Administered 2018-12-25: 80 mL via INTRAVENOUS

## 2018-12-25 MED ORDER — TRAMADOL HCL 50 MG PO TABS: 50.0000 mg | ORAL_TABLET | Freq: Four times a day (QID) | ORAL | 0 refills | Status: AC | PRN

## 2018-12-25 MED ORDER — OXYCODONE-ACETAMINOPHEN 5-325 MG PO TABS
1.0000 | ORAL_TABLET | Freq: Once | ORAL | Status: AC
  Administered 2018-12-25: 1 via ORAL
  Filled 2018-12-25: qty 1

## 2018-12-25 MED ORDER — ACETAMINOPHEN 325 MG PO TABS
650.0000 mg | ORAL_TABLET | Freq: Once | ORAL | Status: AC
  Administered 2018-12-25: 650 mg via ORAL
  Filled 2018-12-25: qty 2

## 2018-12-25 NOTE — ED Notes (Signed)
Pt C/O chest pain. EKG performed and PA and doctor notified.

## 2018-12-25 NOTE — ED Notes (Signed)
Patient transported to CT 
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
## 2018-12-25 NOTE — Discharge Instructions (Addendum)
Follow up with your md next week. °

## 2018-12-26 NOTE — ED Notes (Signed)
Pt ambulatory to waiting room. Pt verbalized understanding of discharge instructions.   
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
